# Patient Record
Sex: Male | Born: 1990 | Race: Black or African American | Hispanic: No | Marital: Single | Smoking: Never smoker
Health system: Southern US, Community
[De-identification: ages and names within clinical notes are randomized; demographics above are authoritative.]

## PROBLEM LIST (undated history)

## (undated) DIAGNOSIS — I10 Essential (primary) hypertension: Secondary | ICD-10-CM

---

## 2018-01-14 ENCOUNTER — Emergency Department (HOSPITAL_COMMUNITY)
Admission: EM | Admit: 2018-01-14 | Discharge: 2018-01-14 | Disposition: A | Discharge status: Home / Self Care | Payer: Self-pay | Attending: Emergency Medicine | Admitting: Emergency Medicine

## 2018-01-14 ENCOUNTER — Encounter (HOSPITAL_COMMUNITY): Payer: Self-pay

## 2018-01-14 ENCOUNTER — Other Ambulatory Visit: Payer: Self-pay

## 2018-01-14 DIAGNOSIS — M5432 Sciatica, left side: Secondary | ICD-10-CM | POA: Insufficient documentation

## 2018-01-14 MED ORDER — PREDNISONE 10 MG PO TABS: 40.0000 mg | ORAL_TABLET | Freq: Every day | ORAL | 0 refills | Status: AC

## 2018-01-14 MED ORDER — METHOCARBAMOL 500 MG PO TABS
500.0000 mg | ORAL_TABLET | Freq: Three times a day (TID) | ORAL | 0 refills | Status: AC | PRN
Start: 2018-01-14 — End: 2018-01-17

## 2018-01-14 NOTE — ED Triage Notes (Signed)
Pt reports having low back pain he has seen a chiropractor for that is worsening, reports weakness and numbness in left leg sometimes when he moves a certain way x 2 weeks.  Denies any injury to his back. Denies any difficulty using the rest room.

## 2018-01-14 NOTE — Discharge Instructions (Addendum)
Given your symptoms and physical exam findings, I suspect you have sciatica or inflammation of sciatic nerve, possibly with some muscle spasms due to overuse/recent activity.    We will treat your inflammation with the following medication regimen: Prednisone 40 mg daily x 5 days Robaxin 500 mg every 8 hours x 3 days Ibuprofen (aleve, advil) 600 mg + acetaminophen (tylenol) 1000 mg every 8 hours for the next 5 days Heating pad as needed Over the counter lidocaine patches (salonpas) can be helpful  Avoid any exacerbating activities for the next 48 hours.  After 48 hours, start doing light back range of motion exercises and walking to avoid worsening back stiffness.   Return for fevers, chills, abdominal pain, changes in bowel movement, urinary symptoms, groin numbness, loss of bladder or bowel control, numbness weakness or heaviness to your extremities, rash.

## 2018-01-14 NOTE — ED Provider Notes (Signed)
[facility] EMERGENCY DEPARTMENT Provider Note   CSN: [id number] Arrival date & time: 01/14/18  1524     History   Chief Complaint Chief Complaint  Patient presents with  . Back Pain    HPI Terry Jacobson is a 27 y.o. male with no past medical history is here for evaluation of low back pain for 4 weeks. States pain was initially worse down the midline but has since moved over to the left side, pain is described as sharp and stabbing. Pain is intermittent, worse with bending forward, bending to the left side or palpation. Alleviated mildly with over-the-counter pain reducing medications, laying down, bending away from the pain to the right. 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 He has seen a chiropractor for this pain but pain is not improving. Associated with intermittent tingling down the back of his left leg. He denies any previous injuries, recent falls, fevers, chills, IV drug use, abdominal pain, urinary symptoms, bladder/bowel incontinence or retention, saddle anesthesia,unilateral numbness or weakness to the extremities.  HPI  History reviewed. No pertinent past medical history.  There are no active problems to display for this patient.   History reviewed. No pertinent surgical history.      Home Medications    Prior to Admission medications   Medication Sig Start Date End Date Taking? Authorizing Provider  methocarbamol (ROBAXIN) 500 MG tablet Take 1 tablet (500 mg total) by mouth every 8 (eight) hours as needed for up to 3 days for muscle spasms. 01/14/18 01/17/18  Liberty Handy, PA-C  predniSONE (DELTASONE) 10 MG tablet Take 4 tablets (40 mg total) by mouth daily for 5 days. 01/14/18 01/19/18  Liberty Handy, PA-C    Family History No family history on file.  Social History Social History   Tobacco Use  . Smoking status: Not on file  Substance Use Topics    . Alcohol use: Not on file  . Drug use: Not on file     Allergies   Patient has no known allergies.   Review of Systems Review of Systems  Musculoskeletal: Positive for back pain, gait problem and myalgias.  Neurological:       +paresthesias LLE  All other systems reviewed and are negative.    Physical Exam Updated Vital Signs BP (!) 133/92 (BP Location: Right Arm)   Pulse 69   Temp 98.5 F (36.9 C) (Oral)   Resp 16   Ht 5\' 8"  (1.727 m)   Wt 90.7 kg (200 lb)   SpO2 98%   BMI 30.41 kg/m   Physical Exam  Constitutional: He appears well-developed and well-nourished. No distress.  HENT:  Head: Normocephalic and atraumatic.  Nose: Nose normal.  Eyes: EOM are normal.  Neck:  Full AROM of cervical spine without pain or rigidity. No midline c-spine tenderness.   Cardiovascular: Normal rate, S1 normal, S2 normal and normal heart sounds.  Pulses:      Radial pulses are 2+ on the right side, and 2+ on the left side.       Dorsalis pedis pulses are 2+ on the right side, and 2+ on the left side.  Pulmonary/Chest: Effort normal and breath sounds normal. He has no decreased breath sounds. He exhibits no tenderness.  Abdominal: Soft. Normal appearance and bowel sounds are normal. There is no tenderness.  No suprapubic or CVA tenderness   Musculoskeletal: He exhibits  tenderness.       Lumbar back: He exhibits tenderness and pain.  Thoracic spine: no midline tenderness or step-offs. Lumbar spine: mild left lumbar paraspinal muscle tenderness. Mild tenderness to SI joint and sciatic notch. Positive straight leg raise bilaterally (SLR exacerbates left low back pain). Negative Faber test bilaterally. No midline tenderness or step-offs. Pelvis: Pelvis stable, no pain with AP/L compression. No leg shortening or rotation. No pain with IR/ER of hips.  Ambulatory in ED with antalgic gait favoring left side.   Neurological:  5/5 strength with flexion/extension of hip, knee and ankle,  bilaterally.  Sensation to light touch intact in lower extremities including feet Knee DTR symmetric bilaterally  Skin: Skin is warm and dry. Capillary refill takes less than 2 seconds.  Psychiatric: He has a normal mood and affect. His behavior is normal. Judgment and thought content normal.     ED Treatments / Results  Labs (all labs ordered are listed, but only abnormal results are displayed) Labs Reviewed - No data to display  EKG None  Radiology No results found.  Procedures Procedures (including critical care time)  Medications Ordered in ED Medications - No data to display   Initial Impression / Assessment and Plan / ED Course  I have reviewed the triage vital signs and the nursing notes.  Pertinent labs & imaging results that were available during my care of the patient were reviewed by me and considered in my medical decision making (see chart for details).    Patient is a 27 y.o. male with left lumbar pain with radiation to left buttock and associated tingling down posterior left leg. Patient can walk but states it aggravates back pain.  Exam as above reassuring and consistent with MSK/radicular symptoms. No abdominal pain, urinary symptoms, recent trauma. Normal lower extremity neurological exam.  No symptoms to suggest pyelonephritis, nephrolithiasis, epidural abscess, AAA, dissection, cauda equina.  No red flag symptoms of back pain including: bladder/bowel incontinence or retention, fevers, chills, unexplained weight loss, h/o cancer, IVDU, recent trauma, overlaying rash, focal neuro deficits, UTI symptoms or CVAT, abdominal pain, midline spinous process tenderness.  No h/o kidney stones.   Initial differential diagnoses include muscular strain/spasms, sciatic nerve inflammation.  Doubt compression fx.  Low concern for cauda equina, epidural abscess, or other serious cause of back pain at this time.     ED labs and imaging not indicated today as abdominal and MSK  exam reassuring and no red flag symptoms present.  I do not suspect bony or intraabdominal/pelvic emergency at this time. Suspect pt experiencing muscular spasm/strain and possibly sciatic nerve inflammation. Conservative measures including prednisone, robaxin, high dose NSAIDs, lidocaine patches with PCP follow-up if no improvement with conservative management. ED return precautions discussed with pt who verbalized understanding and was agreeable with plan.  Final Clinical Impressions(s) / ED Diagnoses   Final diagnoses:  Sciatica of left side    ED Discharge Orders        Ordered    predniSONE (DELTASONE) 10 MG tablet  Daily     01/14/18 1726    methocarbamol (ROBAXIN) 500 MG tablet  Every 8 hours PRN     01/14/18 1726       Liberty HandyGibbons, Claudia J, PA-C 01/14/18 1727    Rolan BuccoBelfi, Melanie, MD 01/14/18 2310

## 2018-01-14 NOTE — ED Notes (Signed)
Patient transported to X-ray 

## 2021-07-02 ENCOUNTER — Emergency Department (HOSPITAL_COMMUNITY): Payer: Self-pay

## 2021-07-02 ENCOUNTER — Emergency Department (HOSPITAL_COMMUNITY)
Admission: EM | Admit: 2021-07-02 | Discharge: 2021-07-03 | Discharge status: Against Medical Advice | Payer: Self-pay | Attending: Emergency Medicine | Admitting: Emergency Medicine

## 2021-07-02 ENCOUNTER — Other Ambulatory Visit: Payer: Self-pay

## 2021-07-02 ENCOUNTER — Encounter (HOSPITAL_COMMUNITY): Payer: Self-pay

## 2021-07-02 DIAGNOSIS — Z5321 Procedure and treatment not carried out due to patient leaving prior to being seen by health care provider: Secondary | ICD-10-CM | POA: Insufficient documentation

## 2021-07-02 DIAGNOSIS — R1110 Vomiting, unspecified: Secondary | ICD-10-CM | POA: Insufficient documentation

## 2021-07-02 DIAGNOSIS — R1030 Lower abdominal pain, unspecified: Secondary | ICD-10-CM | POA: Insufficient documentation

## 2021-07-02 LAB — COMPREHENSIVE METABOLIC PANEL
ALT: 18 U/L (ref 0–44)
AST: 32 U/L (ref 15–41)
Albumin: 4.1 g/dL (ref 3.5–5.0)
Alkaline Phosphatase: 39 U/L (ref 38–126)
Anion gap: 6 (ref 5–15)
BUN: 10 mg/dL (ref 6–20)
CO2: 27 mmol/L (ref 22–32)
Calcium: 9.1 mg/dL (ref 8.9–10.3)
Chloride: 106 mmol/L (ref 98–111)
Creatinine, Ser: 1.11 mg/dL (ref 0.61–1.24)
GFR, Estimated: >60 mL/min (ref >60)
Glucose, Bld: 80 mg/dL (ref 70–99)
Potassium: 4.1 mmol/L (ref 3.5–5.1)
Sodium: 139 mmol/L (ref 135–145)
Total Bilirubin: 0.8 mg/dL (ref 0.3–1.2)
Total Protein: 6.9 g/dL (ref 6.5–8.1)

## 2021-07-02 LAB — URINALYSIS, ROUTINE W REFLEX MICROSCOPIC
Bilirubin Urine: NEGATIVE
Glucose, UA: NEGATIVE mg/dL
Hgb urine dipstick: NEGATIVE
Ketones, ur: 15 mg/dL — AB
Leukocytes,Ua: NEGATIVE
Nitrite: NEGATIVE
Protein, ur: NEGATIVE mg/dL
Specific Gravity, Urine: 1.025 (ref 1.005–1.030)
pH: 6.5 (ref 5.0–8.0)

## 2021-07-02 LAB — LIPASE, BLOOD: Lipase: 22 U/L (ref 11–51)

## 2021-07-02 LAB — CBC
HCT: 44.4 % (ref 39.0–52.0)
Hemoglobin: 14.7 g/dL (ref 13.0–17.0)
MCH: 29.6 pg (ref 26.0–34.0)
MCHC: 33.1 g/dL (ref 30.0–36.0)
MCV: 89.3 fL (ref 80.0–100.0)
Platelets: 190 10*3/uL (ref 150–400)
RBC: 4.97 MIL/uL (ref 4.22–5.81)
RDW: 12.7 % (ref 11.5–15.5)
WBC: 5.2 10*3/uL (ref 4.0–10.5)
nRBC: 0 % (ref 0.0–0.2)

## 2021-07-02 MED ORDER — IOHEXOL 350 MG/ML SOLN
80.0000 mL | Freq: Once | INTRAVENOUS | Status: AC | PRN
  Administered 2021-07-02: 80 mL via INTRAVENOUS

## 2021-07-02 NOTE — ED Triage Notes (Addendum)
v

## 2021-07-02 NOTE — ED Provider Notes (Signed)
Emergency Medicine Provider Triage Evaluation Note  Terry Jacobson , a 30 y.o. male  was evaluated in triage.  Pt complains of abdominal pain that has been going on for the last couple of months.  It is intermittent in nature.  Had 3 episodes of hematemesis this morning.  Drink half a bottle of tequila last night.  Does not drink daily.  Review of Systems  Positive:  Negative: fever, chills, diarrhea, urinary complaints, penile discharge, penile pain  Physical Exam  BP (!) 154/109 (BP Location: Left Arm)   Pulse (!) 55   Temp 99.2 F (37.3 C) (Oral)   Resp 16   Ht 5\' 8"  (1.727 m)   Wt 81.6 kg   SpO2 100%   BMI 27.37 kg/m  Gen:   Awake, no distress   Resp:  Normal effort  MSK:   Moves extremities without difficulty  Other:  Mild tenderness in the left lower quadrant  Medical Decision Making  Medically screening exam initiated at 8:22 PM.  Appropriate orders placed.  Terry Jacobson was informed that the remainder of the evaluation will be completed by another provider, this initial triage assessment does not replace that evaluation, and the importance of remaining in the ED until their evaluation is complete.     Shary Decamp, PA-C 07/02/21 2023    2024, MD 07/04/21 [phone]

## 2021-07-02 NOTE — ED Triage Notes (Signed)
Pt c/o lower abdominal pain x 1 month. Also sts 3 episodes of vomiting.

## 2021-07-03 NOTE — ED Notes (Signed)
Pt request to have IV remove state he leaving. Triage rn made aware IV remove by writer

## 2022-08-26 IMAGING — CT CT ABD-PELV W/ CM
2 of 4 series · 16 of 46 positions shown, 18 images · IV contrast (omnipaque)
Comparison: None.

CLINICAL DATA: Left lower quadrant abdominal pain.

EXAM:
CT ABDOMEN AND PELVIS WITH CONTRAST
TECHNIQUE: Multidetector CT imaging of the abdomen and pelvis was performed
using the standard protocol following bolus administration of
intravenous contrast.
CONTRAST:  80mL OMNIPAQUE IOHEXOL 350 MG/ML SOLN

[Series 2: axial st · axial · 0.78mm/px · z∈[-516,-86]mm · 13 of 98 slices shown, 15 images]
[im 6/98  soft-tissue]
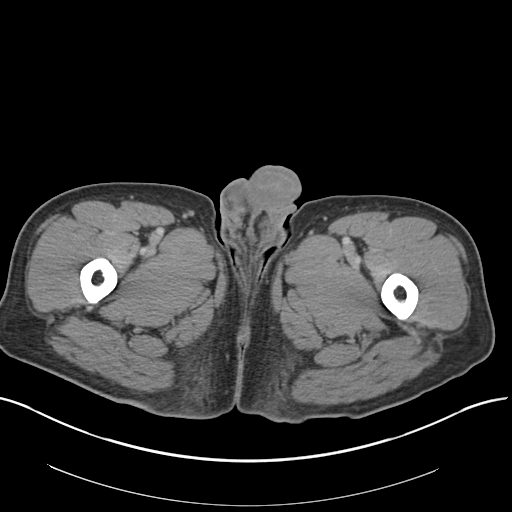
[im 6/98  bone]
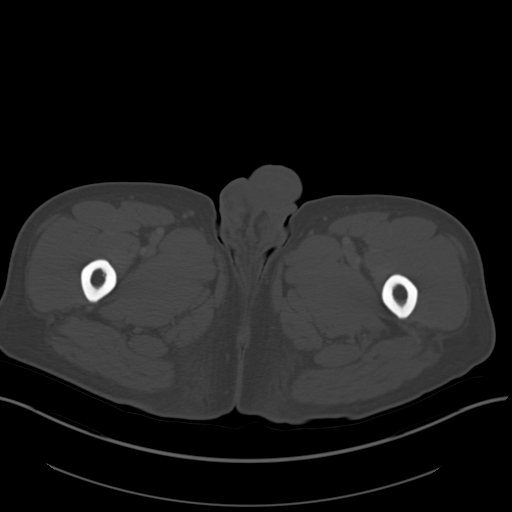
[im 11/98  soft-tissue]
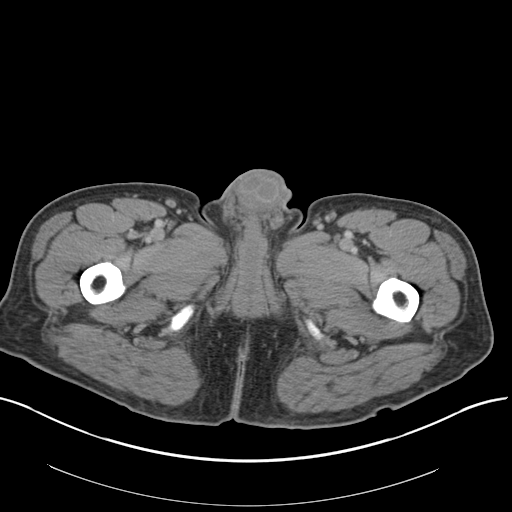
[im 22/98  soft-tissue]
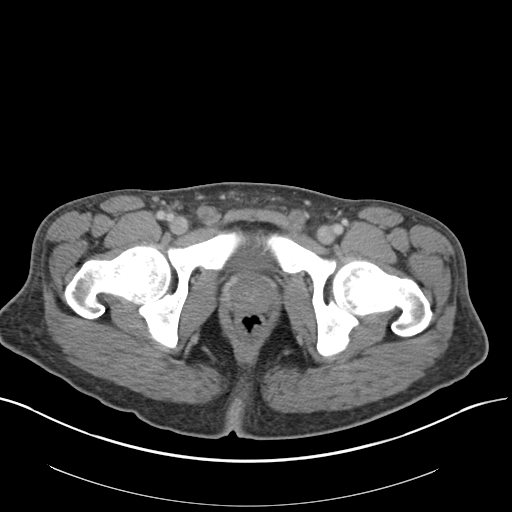
[im 27/98  soft-tissue]
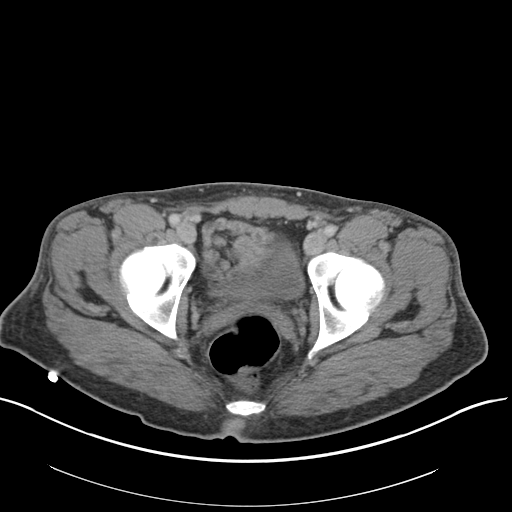
[im 33/98  soft-tissue]
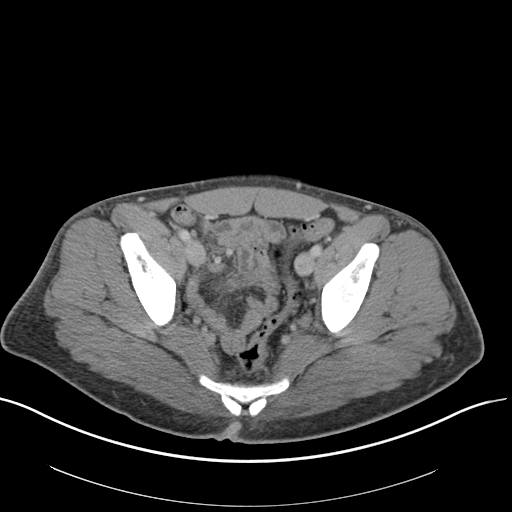
[im 44/98  soft-tissue]
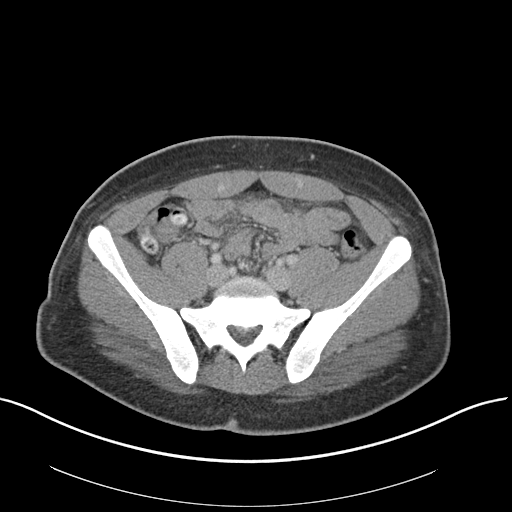
[im 49/98  soft-tissue]
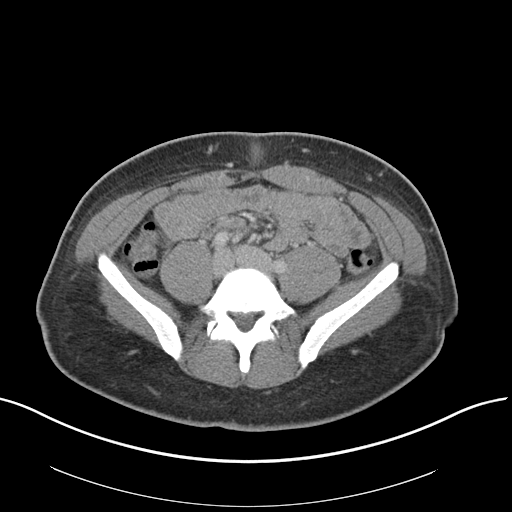
[im 54/98  soft-tissue]
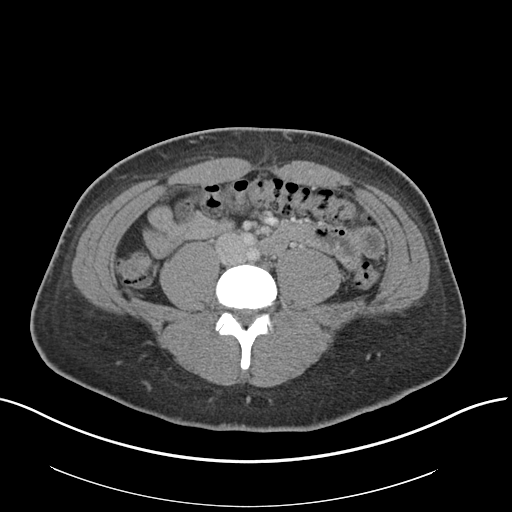
[im 65/98  soft-tissue]
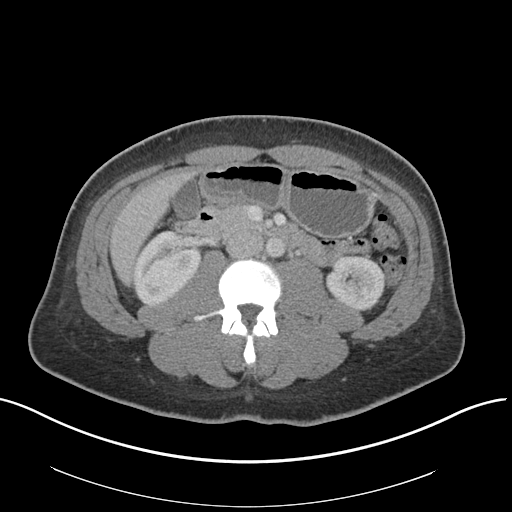
[im 65/98  bone]
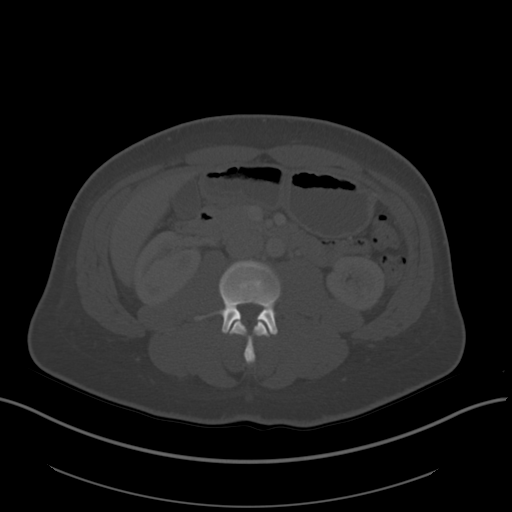
[im 71/98  soft-tissue]
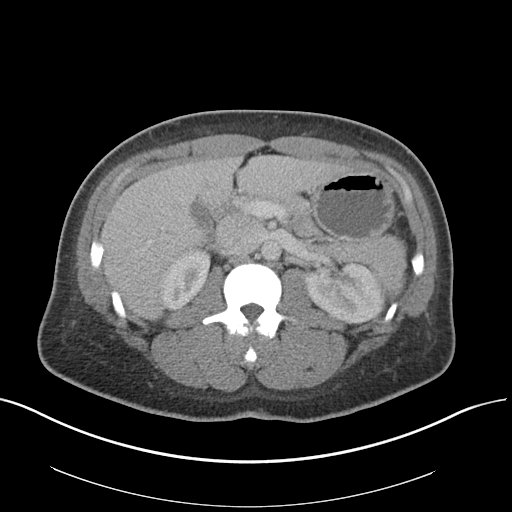
[im 76/98  soft-tissue]
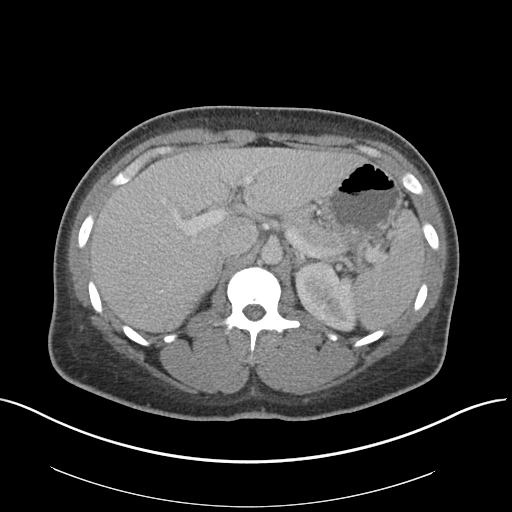
[im 87/98  soft-tissue]
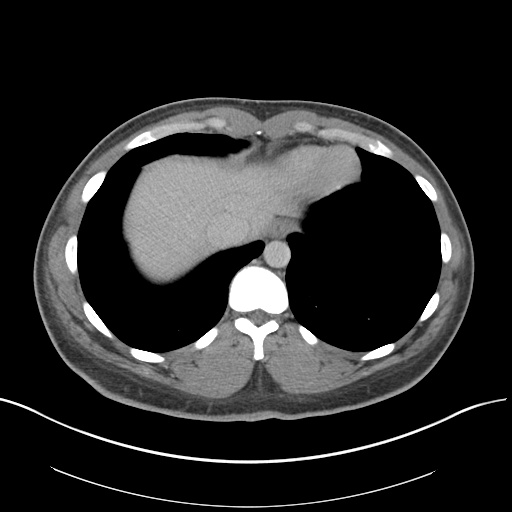
[im 92/98  soft-tissue]
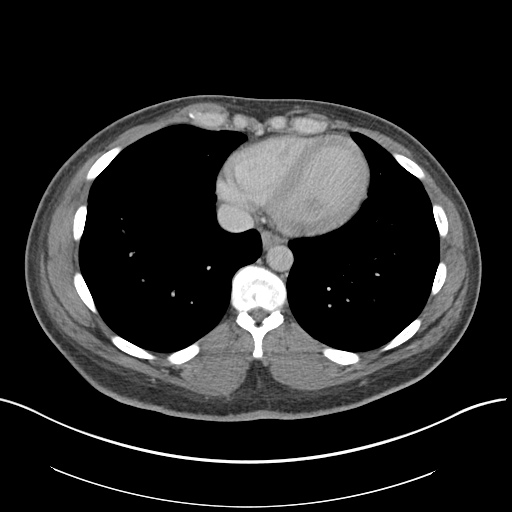

[Series 4: coronal st · coronal · 0.81mm/px · 3 of 129 slices shown]
[im 43/129  soft-tissue]
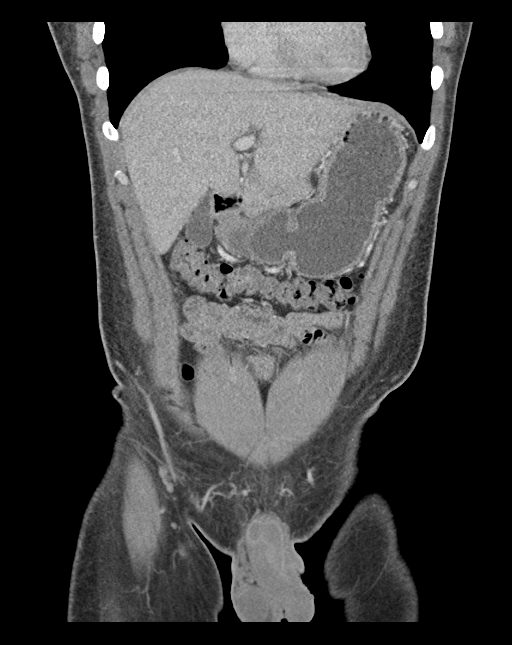
[im 57/129  soft-tissue]
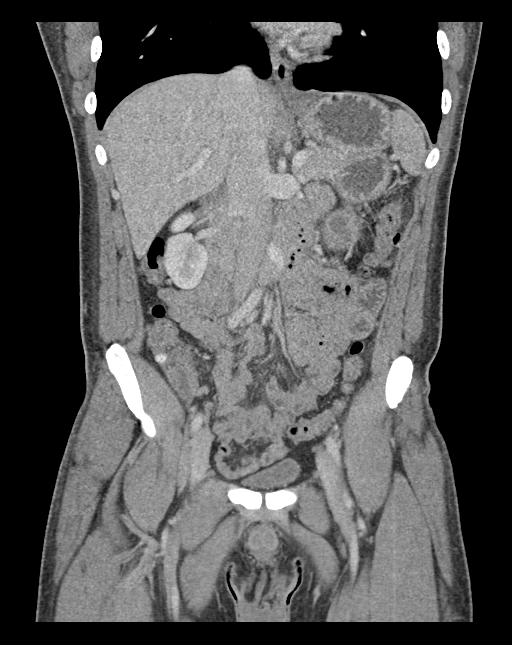
[im 72/129  soft-tissue]
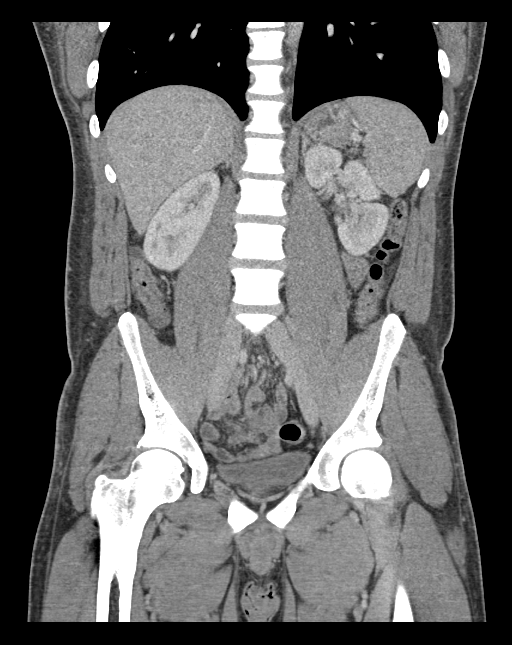

[16 of 46 positions shown; findings below may reference images not displayed]

FINDINGS: Lower chest: No acute abnormality.

Hepatobiliary: No focal liver abnormality is seen. No gallstones,
gallbladder wall thickening, or biliary dilatation.

Pancreas: Unremarkable. No pancreatic ductal dilatation or
surrounding inflammatory changes.

Spleen: Normal in size without focal abnormality.

Adrenals/Urinary Tract: Adrenal glands are unremarkable. Kidneys are
normal, without renal calculi, focal lesion, or hydronephrosis.
Bladder is unremarkable.

Stomach/Bowel: There is diffuse colonic diverticulosis without
evidence for acute diverticulitis. The appendix appears within
normal limits. No focal inflammatory changes are seen. Stomach and
small bowel are within normal limits. There is no free
intraperitoneal air.

Vascular/Lymphatic: No significant vascular findings are present. No
enlarged abdominal or pelvic lymph nodes.

Reproductive: Prostate is unremarkable.

Other: No abdominal wall hernia or abnormality. No abdominopelvic
ascites.

Musculoskeletal: No acute or significant osseous findings.
IMPRESSION: 1. No acute localizing process in the abdomen or pelvis.
2. Diffuse colon diverticulosis without evidence for acute
diverticulitis.

## 2023-11-17 ENCOUNTER — Other Ambulatory Visit: Payer: Self-pay

## 2023-11-17 ENCOUNTER — Encounter (HOSPITAL_COMMUNITY): Payer: Self-pay

## 2023-11-17 ENCOUNTER — Emergency Department (HOSPITAL_COMMUNITY)
Admission: EM | Admit: 2023-11-17 | Discharge: 2023-11-17 | Disposition: A | Discharge status: Home / Self Care | Payer: Self-pay | Attending: Emergency Medicine | Admitting: Emergency Medicine

## 2023-11-17 DIAGNOSIS — M5442 Lumbago with sciatica, left side: Secondary | ICD-10-CM | POA: Insufficient documentation

## 2023-11-17 DIAGNOSIS — M5432 Sciatica, left side: Secondary | ICD-10-CM

## 2023-11-17 DIAGNOSIS — I10 Essential (primary) hypertension: Secondary | ICD-10-CM | POA: Insufficient documentation

## 2023-11-17 MED ORDER — METHOCARBAMOL 500 MG PO TABS: 500.0000 mg | ORAL_TABLET | Freq: Two times a day (BID) | ORAL | 0 refills | Status: DC

## 2023-11-17 MED ORDER — PREDNISONE 10 MG (21) PO TBPK: ORAL_TABLET | Freq: Every day | ORAL | 0 refills | Status: AC

## 2023-11-17 MED ORDER — KETOROLAC TROMETHAMINE 30 MG/ML IJ SOLN
15.0000 mg | Freq: Once | INTRAMUSCULAR | Status: AC
  Administered 2023-11-17: 15 mg via INTRAVENOUS
  Filled 2023-11-17: qty 1

## 2023-11-17 MED ORDER — LIDOCAINE 5 % EX PTCH
2.0000 | MEDICATED_PATCH | CUTANEOUS | Status: DC
  Administered 2023-11-17: 2 via TRANSDERMAL
  Filled 2023-11-17: qty 2

## 2023-11-17 NOTE — ED Provider Notes (Signed)
[place] EMERGENCY DEPARTMENT AT [facility] Provider Note   CSN: [id number] Arrival date & time: 11/17/23  2103     History  Chief Complaint  Patient presents with   Back Pain    Terry Jacobson is a 33 y.o. male with past medical history of hypertension and sciatica presents to emergency department for evaluation of left lower back pain that radiates into left posterior thigh.  He was evaluated in the emergency department in 2019 for similar symptoms on his right side and diagnosed with sciatica.  He reports that the symptoms feel similar but on opposing side.  He denies urinary incontinence, urinary symptoms, IVDU, malignancy, fevers, saddle paresthesia, recent trauma.   Back Pain Associated symptoms: no abdominal pain, no chest pain, no fever, no headaches, no numbness and no weakness      Home Medications Prior to Admission medications   Medication Sig Start Date End Date Taking? Authorizing Provider  methocarbamol (ROBAXIN) 500 MG tablet Take 1 tablet (500 mg total) by mouth 2 (two) times daily. 11/17/23  Yes Judithann Sheen, PA  predniSONE (STERAPRED UNI-PAK 21 TAB) 10 MG (21) TBPK tablet Take by mouth daily. Take 6 tabs by mouth daily  for 2 days, then 5 tabs for 2 days, then 4 tabs for 2 days, then 3 tabs for 2 days, 2 tabs for 2 days, then 1 tab by mouth daily for 2 days 11/17/23  Yes Judithann Sheen, PA      Allergies    Patient has no known allergies.    Review of Systems   Review of Systems  Constitutional:  Negative for chills, fatigue and fever.  Respiratory:  Negative for cough, chest tightness, shortness of breath and wheezing.   Cardiovascular:  Negative for chest pain and palpitations.  Gastrointestinal:  Negative for abdominal pain, constipation, diarrhea, nausea and vomiting.  Musculoskeletal:  Positive for back pain.  Neurological:  Negative for dizziness, seizures, weakness, light-headedness, numbness and headaches.    Physical Exam Updated  Vital Signs BP (!) 140/93   Pulse 71   Temp 98 F (36.7 C)   Resp 20   Ht 5\' 8"  (1.727 m)   Wt 81 kg   SpO2 100%   BMI 27.15 kg/m  Physical Exam Vitals and nursing note reviewed.  Constitutional:      General: He is not in acute distress.    Appearance: Normal appearance.  HENT:     Head: Normocephalic and atraumatic.  Eyes:     General: No scleral icterus.       Right eye: No discharge.        Left eye: No discharge.     Conjunctiva/sclera: Conjunctivae normal.  Cardiovascular:     Rate and Rhythm: Normal rate.     Pulses:          Radial pulses are 2+ on the right side and 2+ on the left side.       Dorsalis pedis pulses are 2+ on the right side and 2+ on the left side.     Heart sounds: Normal heart sounds.  Pulmonary:     Effort: Pulmonary effort is normal. No tachypnea, bradypnea, accessory muscle usage or respiratory distress.     Breath sounds: Normal breath sounds.  Musculoskeletal:     Cervical back: Normal range of motion and neck supple. No rigidity, tenderness or bony tenderness. No spinous process tenderness or muscular tenderness. Normal range of motion.     Thoracic back: No  tenderness or bony tenderness. Normal range of motion.     Lumbar back: Tenderness (L lumbar paraspinous musculature) and bony tenderness (mild TTP of lumbar midspinous processes) present. No spasms. Normal range of motion. Positive left straight leg raise test. Negative right straight leg raise test.     Right lower leg: No edema.     Left lower leg: No edema.  Skin:    General: Skin is warm.     Capillary Refill: Capillary refill takes less than 2 seconds.     Coloration: Skin is not jaundiced or pale.  Neurological:     Mental Status: He is alert and oriented to person, place, and time. Mental status is at baseline.     GCS: GCS eye subscore is 4. GCS verbal subscore is 5. GCS motor subscore is 6.     Gait: Gait normal.     Comments: 5/5 motor function of dorsi and plantarflexion of  BLE.  Positive straight leg raise of LLE.  Sensation 2/2 of dermatomes L2-S2 BLE.  Ambulates without difficulty. No abnormal gait nor weakness     ED Results / Procedures / Treatments   Labs (all labs ordered are listed, but only abnormal results are displayed) Labs Reviewed - No data to display  EKG None  Radiology No results found.  Procedures Procedures    Medications Ordered in ED Medications  lidocaine (LIDODERM) 5 % 2 patch (2 patches Transdermal Patch Applied 11/17/23 2150)  ketorolac (TORADOL) 30 MG/ML injection 15 mg (15 mg Intravenous Given 11/17/23 2150)    ED Course/ Medical Decision Making/ A&P                                 Medical Decision Making Risk Prescription drug management.   Patient presents to the ED for concern of left lower back pain that radiates into left posterior leg, this involves an extensive number of treatment options, and is a complaint that carries with it a high risk of complications and morbidity.  The differential diagnosis includes sciatica, herniated disc, DDD.  Less likely fracture, cauda equina, infection, neoplasm   Co morbidities that complicate the patient evaluation  Pmhx of sciatica   Additional history obtained:  Additional history obtained from Nursing and Outside Medical Records   External records from outside source obtained and reviewed including  Triage RN note ED workup from 2019 where he had similar symptoms of sciatica Provided prescription for muscle relaxer and steroid which she reports significantly improved pain    Medicines ordered and prescription drug management:  I ordered medication including Toradol, lidocaine patch, Robaxin, prednisone for [place] flare Reevaluation of the patient after these medicines showed that the patient improved I have reviewed the patients home medicines and have made adjustments as needed    Problem List / ED Course:  Sciatica, left side Positive straight leg  raise.  Symptoms feel similar to when he had sciatica on right side Has not had imaging of his back since last time symptoms occurred as patient has not followed up with PCP Will provide lidocaine patch and Toradol shot here in emergency department Provided prescription for steroids and muscle relaxers to [facility] Discussed that patient cannot drink alcohol nor operate heavy machinery with muscle relaxers.  Discussed that he can use Tylenol in addition to muscle relaxer, steroids for pain.  Provided sciatica stretches and strict return to emergency department precautions.  He expresses understanding and  agrees with plan.  All questions answered to his satisfaction.  He is agreeable discharge. HTN Was prescribed losartan and lisinopril for hypertension by urgent care.  He reports that he is only taking lisinopril 5 mg however irregularly and often misses doses Initial blood pressure here was 172/103.  Likely elevated due to pain, stress of imaging ED visit, noncompliance with blood pressure medications.  However he had no complaints of headache, blurred vision, alarm symptoms.  BP decreased to 140/93 by end of ED visit I discussed the importance of blood pressure compliance and not missing dose as well as follow-up with PCP regarding blood pressure management Provided patient with [place] [facility] for PCP follow-up, routine medical maintenance, blood pressure management, should he need imaging of his back in future  Reevaluation:  After the interventions noted above, I reevaluated the patient and found that they have :improved   Social Determinants of Health:  Is not had PCP follow-up.  Will provide [place] [facility] for PCP follow-up for blood pressure management, possible imaging of back, routine medical maintenance   Dispostion:  After consideration of the diagnostic results and the patients response to treatment, I feel that the patent  would benefit from outpatient management with symptomatic medication.  PCP establishment and follow-up   Final Clinical Impression(s) / ED Diagnoses Final diagnoses:  Sciatica of left side    Rx / DC Orders ED Discharge Orders          Ordered    methocarbamol (ROBAXIN) 500 MG tablet  2 times daily        11/17/23 2146    predniSONE (STERAPRED UNI-PAK 21 TAB) 10 MG (21) TBPK tablet  Daily        11/17/23 2146              Judithann Sheen, PA 11/17/23 2303    Charlynne Pander, MD 11/19/23 1455

## 2023-11-17 NOTE — Discharge Instructions (Addendum)
Thank you for letting us evaluate you today.  This appears to be one of your sciatica flares.  I have given you a "strong ibuprofen "shot here in the emergency department as well as lidocaine patches.  You may start taking ibuprofen tomorrow.  You can take Tylenol as needed for pain as well with ibuprofen starting now.  I have sent Robaxin and a steroid to your pharmacy for symptom control.  I have also included sciatica stretches that I need to do.  I have included [place] [facility] for you to follow-up with PCP regarding routine medical maintenance and imaging of your back in the future  Return to emergency department if you have numbness or loss of sensation in genital region, urinary incontinence, significant worsening of pain

## 2023-11-17 NOTE — ED Triage Notes (Signed)
C/o lower back pain radiating into left leg and neck pain.  Pain is worse bending over.

## 2024-06-07 ENCOUNTER — Emergency Department (HOSPITAL_COMMUNITY): Payer: Self-pay

## 2024-06-07 ENCOUNTER — Emergency Department (HOSPITAL_COMMUNITY)
Admission: EM | Admit: 2024-06-07 | Discharge: 2024-06-08 | Discharge status: Against Medical Advice | Payer: Self-pay | Attending: Emergency Medicine | Admitting: Emergency Medicine

## 2024-06-07 ENCOUNTER — Other Ambulatory Visit: Payer: Self-pay

## 2024-06-07 ENCOUNTER — Encounter (HOSPITAL_COMMUNITY): Payer: Self-pay

## 2024-06-07 DIAGNOSIS — R0602 Shortness of breath: Secondary | ICD-10-CM | POA: Insufficient documentation

## 2024-06-07 DIAGNOSIS — I10 Essential (primary) hypertension: Secondary | ICD-10-CM | POA: Insufficient documentation

## 2024-06-07 DIAGNOSIS — M79642 Pain in left hand: Secondary | ICD-10-CM | POA: Insufficient documentation

## 2024-06-07 DIAGNOSIS — Z5321 Procedure and treatment not carried out due to patient leaving prior to being seen by health care provider: Secondary | ICD-10-CM | POA: Insufficient documentation

## 2024-06-07 DIAGNOSIS — F17200 Nicotine dependence, unspecified, uncomplicated: Secondary | ICD-10-CM | POA: Insufficient documentation

## 2024-06-07 HISTORY — DX: Essential (primary) hypertension: I10

## 2024-06-07 LAB — BASIC METABOLIC PANEL WITH GFR
Anion gap: 7 (ref 5–15)
BUN: 8 mg/dL (ref 6–20)
CO2: 25 mmol/L (ref 22–32)
Calcium: 9.7 mg/dL (ref 8.9–10.3)
Chloride: 104 mmol/L (ref 98–111)
Creatinine, Ser: 1.04 mg/dL (ref 0.61–1.24)
GFR, Estimated: >60 mL/min (ref >60)
Glucose, Bld: 85 mg/dL (ref 70–99)
Potassium: 5 mmol/L (ref 3.5–5.1)
Sodium: 136 mmol/L (ref 135–145)

## 2024-06-07 LAB — CBC
HCT: 42.7 % (ref 39.0–52.0)
Hemoglobin: 14.4 g/dL (ref 13.0–17.0)
MCH: 29.8 pg (ref 26.0–34.0)
MCHC: 33.7 g/dL (ref 30.0–36.0)
MCV: 88.2 fL (ref 80.0–100.0)
Platelets: 186 K/uL (ref 150–400)
RBC: 4.84 MIL/uL (ref 4.22–5.81)
RDW: 12.1 % (ref 11.5–15.5)
WBC: 3.3 K/uL — ABNORMAL LOW (ref 4.0–10.5)
nRBC: 0 % (ref 0.0–0.2)

## 2024-06-07 LAB — TROPONIN I (HIGH SENSITIVITY)
Troponin I (High Sensitivity): 3 ng/L (ref <18)
Troponin I (High Sensitivity): <2 ng/L (ref <18)

## 2024-06-07 NOTE — ED Provider Triage Note (Signed)
 Emergency Medicine Provider Triage Evaluation Note  Sukhraj Esquivias , a 33 y.o. male  was evaluated in triage.  Pt complains of shortness of breath, intermittent pain to the left hand.  Reports tobacco, marijuana use.  Shortness of breath on and off for a month.  Denies any fever, chills, chest pain.  Review of Systems  Positive: shob Negative: Fever, chills, chest pain  Physical Exam  BP (!) 138/104   Pulse 69   Temp 98.2 F (36.8 C) (Oral)   Resp 16   Ht 5' 8 (1.727 m)   Wt 81.6 kg   SpO2 99%   BMI 27.37 kg/m  Gen:   Awake, no distress   Resp:  Normal effort  MSK:   Moves extremities without difficulty  Other:  Mild rhonchi, no wheezing noted in right lung fields, no increased work of breathing  Medical Decision Making  Medically screening exam initiated at 5:32 PM.  Appropriate orders placed.  Rony Ratz was informed that the remainder of the evaluation will be completed by another provider, this initial triage assessment does not replace that evaluation, and the importance of remaining in the ED until their evaluation is complete.  Workup initiated in triage    Rosan Sherlean DEL, [place] 06/07/24 1732

## 2024-06-07 NOTE — ED Triage Notes (Signed)
 Pt reports that today he had an episode of shortness of breath pt reports sharp pain in left hand.

## 2024-06-07 NOTE — ED Triage Notes (Signed)
 Pt reports SHOB x1 month. Ambulatory in lobby.

## 2024-06-07 NOTE — ED Notes (Signed)
 No acute STEMI per Dr. Zackowski

## 2024-06-07 NOTE — ED Notes (Signed)
 Pt is hypertensive, pt has hypertension and is on medication but did not take it today.
